# Patient Record
Sex: Male | Born: 1935 | Race: White | Hispanic: No | Smoking: Former smoker
Health system: Southern US, Community
[De-identification: ages and names within clinical notes are randomized; demographics above are authoritative.]

## PROBLEM LIST (undated history)

## (undated) DIAGNOSIS — E785 Hyperlipidemia, unspecified: Secondary | ICD-10-CM

## (undated) DIAGNOSIS — E559 Vitamin D deficiency, unspecified: Secondary | ICD-10-CM

## (undated) DIAGNOSIS — I10 Essential (primary) hypertension: Secondary | ICD-10-CM

## (undated) DIAGNOSIS — E291 Testicular hypofunction: Secondary | ICD-10-CM

## (undated) DIAGNOSIS — C801 Malignant (primary) neoplasm, unspecified: Secondary | ICD-10-CM

## (undated) DIAGNOSIS — E119 Type 2 diabetes mellitus without complications: Secondary | ICD-10-CM

## (undated) HISTORY — DX: Hyperlipidemia, unspecified: E78.5

## (undated) HISTORY — DX: Testicular hypofunction: E29.1

## (undated) HISTORY — DX: Vitamin D deficiency, unspecified: E55.9

## (undated) HISTORY — DX: Malignant (primary) neoplasm, unspecified: C80.1

## (undated) HISTORY — DX: Essential (primary) hypertension: I10

## (undated) HISTORY — DX: Type 2 diabetes mellitus without complications: E11.9

---

## 1998-02-05 ENCOUNTER — Ambulatory Visit (HOSPITAL_COMMUNITY): Admission: RE | Admit: 1998-02-05 | Discharge: 1998-02-05 | Payer: Self-pay | Admitting: Gastroenterology

## 1999-05-29 ENCOUNTER — Ambulatory Visit (HOSPITAL_COMMUNITY): Admission: RE | Admit: 1999-05-29 | Discharge: 1999-05-29 | Payer: Self-pay | Admitting: Internal Medicine

## 1999-05-29 ENCOUNTER — Encounter: Payer: Self-pay | Admitting: Internal Medicine

## 1999-11-27 ENCOUNTER — Encounter: Payer: Self-pay | Admitting: Internal Medicine

## 1999-11-27 ENCOUNTER — Ambulatory Visit (HOSPITAL_COMMUNITY): Admission: RE | Admit: 1999-11-27 | Discharge: 1999-11-27 | Payer: Self-pay | Admitting: Internal Medicine

## 2000-03-30 ENCOUNTER — Ambulatory Visit (HOSPITAL_BASED_OUTPATIENT_CLINIC_OR_DEPARTMENT_OTHER): Admission: RE | Admit: 2000-03-30 | Discharge: 2000-03-30 | Payer: Self-pay | Admitting: Plastic Surgery

## 2000-04-01 ENCOUNTER — Ambulatory Visit (HOSPITAL_BASED_OUTPATIENT_CLINIC_OR_DEPARTMENT_OTHER): Admission: RE | Admit: 2000-04-01 | Discharge: 2000-04-01 | Payer: Self-pay | Admitting: Plastic Surgery

## 2001-06-24 ENCOUNTER — Encounter: Payer: Self-pay | Admitting: Internal Medicine

## 2001-06-24 ENCOUNTER — Ambulatory Visit (HOSPITAL_COMMUNITY): Admission: RE | Admit: 2001-06-24 | Discharge: 2001-06-24 | Payer: Self-pay | Admitting: Internal Medicine

## 2003-08-05 ENCOUNTER — Ambulatory Visit (HOSPITAL_COMMUNITY): Admission: RE | Admit: 2003-08-05 | Discharge: 2003-08-05 | Payer: Self-pay | Admitting: Internal Medicine

## 2007-03-23 ENCOUNTER — Emergency Department (HOSPITAL_COMMUNITY): Admission: EM | Admit: 2007-03-23 | Discharge: 2007-03-23 | Payer: Self-pay | Admitting: Emergency Medicine

## 2007-09-22 ENCOUNTER — Ambulatory Visit (HOSPITAL_BASED_OUTPATIENT_CLINIC_OR_DEPARTMENT_OTHER): Admission: RE | Admit: 2007-09-22 | Discharge: 2007-09-22 | Payer: Self-pay | Admitting: Urology

## 2008-04-20 ENCOUNTER — Encounter: Admission: RE | Admit: 2008-04-20 | Discharge: 2008-04-20 | Payer: Self-pay | Admitting: Cardiothoracic Surgery

## 2010-03-06 ENCOUNTER — Encounter (HOSPITAL_COMMUNITY): Admission: RE | Admit: 2010-03-06 | Discharge: 2010-03-11 | Payer: Self-pay | Admitting: Urology

## 2011-01-07 NOTE — Op Note (Signed)
NAME:  Nicholas Whitney, Nicholas Whitney                 ACCOUNT NO.:  [id number]   MEDICAL RECORD NO.:  [id number]         PATIENT TYPE:  HAMB   LOCATION:                               FACILITY:  [facility]   PHYSICIAN:  Sigmund I. Patsi Sears, M.D.DATE OF BIRTH:  02-26-1929   DATE OF PROCEDURE:  09/22/2007  DATE OF DISCHARGE:                               OPERATIVE REPORT   PREOPERATIVE DIAGNOSIS:  Adenocarcinoma of the prostate, T1c.   POSTOPERATIVE DIAGNOSIS:  Adenocarcinoma of the prostate, T1c.   OPERATIONS:  Cryotherapy of the prostate.   SURGEON:  Sigmund I. Patsi Sears, M.D.   ANESTHESIA:  General LMA.   PREPARATION:  After appropriate preanesthesia, the patient was brought  to the operating room and placed on the operating room table in dorsal  supine position where general LMA anesthesia was induced.  He was then  replaced in dorsal lithotomy position where the pubis was prepped with  Betadine solution and draped in the usual fashion.   BRIEF HISTORY:  [name] is a 77 year old male with T1c adenocarcinoma  of the prostate, Gleason 3 plus 4 (7), in 60% of the left lateral  biopsies.  PSA is 6.37.  Original prostate size was 84 mL, and was  downsized to 47 mL with Lupron.  The patient's additional history is  significant for BPH treated with Terazosin, type 2 diabetes, ED and  hypogonadism.   PROCEDURE IN DETAIL:  With the patient in the supine position, general  LMA anesthesia was introduced.  Flexible cystoscopy was accomplished, in  the Trendelenburg position, and suprapubic 0.5 cm incision was made and  subcutaneous tissue dilated with a clamp.  A Cook suprapubic tube is  then placed under direct vision into the dome of the bladder and coiled  into position.  It is sutured in position with 3-0 nylon suture.  The  suture which comes with the [facility] suprapubic tube was nonfunctioning.  The tube was placed to straight drainage.  A Foley catheter was then  placed, and the scrotum was taped to  the abdominal wall.  The scrotum  was notably small, with atrophic testes within.   Transrectal ultrasonography was accomplished, and under real time  ultrasound control, cryotherapy needles were placed in 4 rows.  Thermocouple device was then placed in the Denonvilliers' fascia, as  well as at the urethral sphincter.   The patient then underwent 2 separate freeze - thaw cycles, with active  freezing to -40 degrees Centigrade, and passive thawing.  The patient  tolerated the procedure well.  There was minimal bleeding.  It is noted  that a urethral  warming device was used throughout the case, and was placed after  cystoscopy showed no needles within the urethra.  The patient was given  a B & O suppository at the end of the procedure, and was given IV  Toradol at the end of the procedure.  He was awakened and taken to the  recovery room in good condition.      Sigmund I. Patsi Sears, M.D.  Electronically Signed     SIT/MEDQ  D:  09/22/2007  T:  09/22/2007  Job:  [id number]   cc:   Lucky Cowboy, M.D.  Fax: [phone]

## 2011-01-10 NOTE — Op Note (Signed)
[place]. [facility]  Patient:    Nicholas Whitney, Nicholas Whitney                        MRN: [id number] Proc. Date: 04/01/00 Adm. Date:  [id number] Disc. Date: [id number] Attending:  Chapman Moss                           Operative Report  PREOPERATIVE DIAGNOSIS:  A complicated, open wound of the left ear helical rim secondary to basal cell carcinoma excision.  POSTOPERATIVE DIAGNOSIS: A complicated, open wound of the left ear helical rim secondary to basal cell carcinoma excision.  PROCEDURE PERFORMED:  A planned, staged procedure, a helical rim sliding ear flap for closure ear rim defect.  SURGEON:  Teena Irani. Odis Luster, M.D.  ANESTHESIA:  MAC sedation with 1% Xylocaine with 1:100,000 epinephrine.  CLINICAL NOTE:  A 77 year old man with a fairly large basal cell carcinoma at the helical rim of his left ear.  This was excised two days ago.  Permanent pathology report by verbal report from Dr. Perlie Gold ______ revealed negative margins.  He was now returned to surgery today for a planned staged procedure to reconstruct the defect.  It was necessary to delay the reconstruction in order to obtain the negative margin report.  He understood the nature of the procedure and risks of postoperative complications and the overall convalescence and wished to proceed.  DESCRIPTION OF PROCEDURE:  The patient taken to the operating room and placed supine.  After successful sedation, some 1% Xylocaine with epinephrine plus bicarb was then infiltrated and the dressing was removed.  He was prepped with Betadine and draped with sterile drapes.  The wound was cleaned by scraping the wound thoroughly and then irrigating it with saline.  The flap was then anesthetized using the Xylocaine with epinephrine plus bicarb.  This was performed as a more or less as a field block.  The incision was then placed along the helical rim along the helical crease down into the ear lobule.  The flap was  then elevated off the back of the ear using sharp dissection. Meticulous hemostasis with the electrocautery.  Thorough irrigation with saline and again meticulous hemostasis.  The advancement was performed and a Burroughs wedge was taken out at the ear lobule on the nonflap side in order to allow for a flap advancement.  Again, thorough check was made for hemostasis and it was noted to be excellent.  The insetting of the flap 4-0 nylon simple interrupted sutures, interrupted vertical mattress sutures as needed and some 5-0 nylon simple, interrupted sutures along the donor site. The flap had excellent color, excellent capillary refill right up to the very edge.  The wound was dressed with bacitracin and antibiotic ointment and Xeroform gauze and a very light 4 x 4 dressing.  He tolerated the procedure well.  DISPOSITION:  Keep his head elevated.  No lifting, no exercising, no showering.  Darvocet-N 100 for pain, has already been written previously, 1-2 p.o. q.6h. p.r.n. for pain.  Keflex 500 mg p.o. t.i.d.  Will see him back in the office in eight days for recheck or sooner if there are problems. DD:  04/01/00 TD:  04/01/00 Job: [id number]

## 2011-01-10 NOTE — Op Note (Signed)
[place]. [facility]  Patient:    Nicholas Whitney, Nicholas Whitney                        MRN: [id number] Proc. Date: 03/30/00 Adm. Date:  [id number] Attending:  Chapman Moss                           Operative Report  PREOPERATIVE DIAGNOSIS:  Basal cell carcinoma of the left ear greater than 1.0 cm in diameter.  POSTOPERATIVE DIAGNOSIS:  Basal cell carcinoma of the left ear greater than 1.0 cm in diameter.  PROCEDURE:  Excision basal cell carcinoma of the left ear greater than 1.0 cm. diameter.  SURGEON:  Teena Irani. Odis Luster, M.D.  ANESTHESIA:  1% XYLOCAINE with EPINEPHRINE plus ______  CLINICAL NOTE:  This is 77 year old man who presents for excision of a fairly large basal cell carcinoma of the helical rim of his left ear.  This is ulcerated and weeping.  The nature of the procedure and the risks were discussed with him.  He understands this to be planned staged procedures.  The excision today confirming negative margins on the pathology followed up in 2 days from now with a flap post surgery to reconstruct.  He was very accepting of this plan and wished to proceed.  PROCEDURE:  The patient was brought to the operating room and placed supine. Under Loupe magnification the planned excision was then marked and he was prepped with Betadine and draped in sterile drapes.  Subsequently local anesthesia was achieved and the excision was performed.  This excision did extend into the underlying cartilage.  Good hemostasis was noted.  A portion of the wound posteriorly was closed with 5-0 nylon interrupted vertical mattress sutures.  The specimen had already been removed from the table and tagged at its superior margin with a stitch.  Bacitracin antibiotic ointment and a xeroform gauze were then placed in the wound and the xeroform gauze was secured with a few 5-0 nylon simple interrupted sutures around the periphery. Dry sterile dressing applied over this.  He tolerated  the procedure well.  DISPOSITION:  Darvocet-N 100 total of 15 given 1 p.o. q.4h. p.r.n. for pain.  FOLLOWUP:  See him back here in 2 days for his reconstructive surgery.  Keep the dressing dry and intact.  ACTIVITY:  No lifting, no vigorous activities. DD:  03/30/00 TD:  03/31/00 Job: [id number]

## 2011-05-15 LAB — I-STAT 8, (EC8 V) (CONVERTED LAB)
BUN: 14
Chloride: 103
HCT: 39
Hemoglobin: 13.3
Operator id: 114531
TCO2: 32

## 2011-06-09 LAB — URINALYSIS, ROUTINE W REFLEX MICROSCOPIC
Glucose, UA: NEGATIVE
Ketones, ur: NEGATIVE
Leukocytes, UA: NEGATIVE
Nitrite: NEGATIVE
Protein, ur: NEGATIVE
Specific Gravity, Urine: 1.011
Urobilinogen, UA: 0.2
pH: 6.5

## 2011-06-09 LAB — URINE MICROSCOPIC-ADD ON

## 2013-04-21 ENCOUNTER — Other Ambulatory Visit: Payer: Self-pay | Admitting: Internal Medicine

## 2013-04-21 DIAGNOSIS — R413 Other amnesia: Secondary | ICD-10-CM

## 2013-05-11 ENCOUNTER — Ambulatory Visit
Admission: RE | Admit: 2013-05-11 | Discharge: 2013-05-11 | Disposition: A | Discharge status: Home / Self Care | Payer: Medicare Other | Source: Ambulatory Visit | Attending: Internal Medicine | Admitting: Internal Medicine

## 2013-05-11 DIAGNOSIS — R413 Other amnesia: Secondary | ICD-10-CM

## 2013-05-11 MED ORDER — IOHEXOL 300 MG/ML  SOLN
75.0000 mL | Freq: Once | INTRAMUSCULAR | Status: AC | PRN
  Administered 2013-05-11: 75 mL via INTRAVENOUS

## 2013-07-18 ENCOUNTER — Encounter: Payer: Self-pay | Admitting: Internal Medicine

## 2013-07-19 ENCOUNTER — Ambulatory Visit: Payer: Medicare Other | Admitting: Internal Medicine

## 2013-07-19 ENCOUNTER — Encounter: Payer: Self-pay | Admitting: Internal Medicine

## 2013-07-19 VITALS — BP 138/60 | HR 64 | Temp 98.2°F | Resp 18 | Ht 65.75 in | Wt 165.8 lb

## 2013-07-19 DIAGNOSIS — E119 Type 2 diabetes mellitus without complications: Secondary | ICD-10-CM

## 2013-07-19 DIAGNOSIS — E1129 Type 2 diabetes mellitus with other diabetic kidney complication: Secondary | ICD-10-CM | POA: Insufficient documentation

## 2013-07-19 DIAGNOSIS — Z79899 Other long term (current) drug therapy: Secondary | ICD-10-CM

## 2013-07-19 DIAGNOSIS — E782 Mixed hyperlipidemia: Secondary | ICD-10-CM | POA: Insufficient documentation

## 2013-07-19 DIAGNOSIS — E559 Vitamin D deficiency, unspecified: Secondary | ICD-10-CM | POA: Insufficient documentation

## 2013-07-19 DIAGNOSIS — F0280 Dementia in other diseases classified elsewhere without behavioral disturbance: Secondary | ICD-10-CM

## 2013-07-19 DIAGNOSIS — I10 Essential (primary) hypertension: Secondary | ICD-10-CM | POA: Insufficient documentation

## 2013-07-19 LAB — CBC WITH DIFFERENTIAL/PLATELET
Basophils Absolute: 0.1 10*3/uL (ref 0.0–0.1)
Basophils Relative: 1 % (ref 0–1)
Eosinophils Relative: 2 % (ref 0–5)
Hemoglobin: 13.4 g/dL (ref 13.0–17.0)
Lymphocytes Relative: 37 % (ref 12–46)
MCV: 89.3 fL (ref 78.0–100.0)
Neutrophils Relative %: 51 % (ref 43–77)
RBC: 4.41 MIL/uL (ref 4.22–5.81)
WBC: 8.1 10*3/uL (ref 4.0–10.5)

## 2013-07-19 MED ORDER — CITALOPRAM HYDROBROMIDE 40 MG PO TABS: 20.0000 mg | ORAL_TABLET | Freq: Every day | ORAL | Status: AC

## 2013-07-19 NOTE — Patient Instructions (Addendum)
Continue diet & medications same as discussed.   Further disposition pending lab results.    Recommend decrease Citalopram to 1/2 tab  = 20 mg daily   Hypertension As your heart beats, it forces blood through your arteries. This force is your blood pressure. If the pressure is too high, it is called hypertension (HTN) or high blood pressure. HTN is dangerous because you may have it and not know it. High blood pressure may mean that your heart has to work harder to pump blood. Your arteries may be narrow or stiff. The extra work puts you at risk for heart disease, stroke, and other problems.  Blood pressure consists of two numbers, a higher number over a lower, 110/72, for example. It is stated as "110 over 72." The ideal is below 120 for the top number (systolic) and under 80 for the bottom (diastolic). Write down your blood pressure today. You should pay close attention to your blood pressure if you have certain conditions such as:  Heart failure.  Prior heart attack.  Diabetes  Chronic kidney disease.  Prior stroke.  Multiple risk factors for heart disease. To see if you have HTN, your blood pressure should be measured while you are seated with your arm held at the level of the heart. It should be measured at least twice. A one-time elevated blood pressure reading (especially in the Emergency Department) does not mean that you need treatment. There may be conditions in which the blood pressure is different between your right and left arms. It is important to see your caregiver soon for a recheck. Most people have essential hypertension which means that there is not a specific cause. This type of high blood pressure may be lowered by changing lifestyle factors such as:  Stress.  Smoking.  Lack of exercise.  Excessive weight.  Drug/tobacco/alcohol use.  Eating less salt. Most people do not have symptoms from high blood pressure until it has caused damage to the body.  Effective treatment can often prevent, delay or reduce that damage. TREATMENT  When a cause has been identified, treatment for high blood pressure is directed at the cause. There are a large number of medications to treat HTN. These fall into several categories, and your caregiver will help you select the medicines that are best for you. Medications may have side effects. You should review side effects with your caregiver. If your blood pressure stays high after you have made lifestyle changes or started on medicines,   Your medication(s) may need to be changed.  Other problems may need to be addressed.  Be certain you understand your prescriptions, and know how and when to take your medicine.  Be sure to follow up with your caregiver within the time frame advised (usually within two weeks) to have your blood pressure rechecked and to review your medications.  If you are taking more than one medicine to lower your blood pressure, make sure you know how and at what times they should be taken. Taking two medicines at the same time can result in blood pressure that is too low. SEEK IMMEDIATE MEDICAL CARE IF:  You develop a severe headache, blurred or changing vision, or confusion.  You have unusual weakness or numbness, or a faint feeling.  You have severe chest or abdominal pain, vomiting, or breathing problems. MAKE SURE YOU:   Understand these instructions.  Will watch your condition.  Will get help right away if you are not doing well or get worse.  Document Released: 08/11/2005 Document Revised: 11/03/2011 Document Reviewed: 03/31/2008 [facility] Patient Information 2014 [place], [place].  Diabetes and Exercise Exercising regularly is important. It is not just about losing weight. It has many health benefits, such as:  Improving your overall fitness, flexibility, and endurance.  Increasing your bone density.  Helping with weight control.  Decreasing your body fat.  Increasing  your muscle strength.  Reducing stress and tension.  Improving your overall health. People with diabetes who exercise gain additional benefits because exercise:  Reduces appetite.  Improves the body's use of blood sugar (glucose).  Helps lower or control blood glucose.  Decreases blood pressure.  Helps control blood lipids (such as cholesterol and triglycerides).  Improves the body's use of the hormone insulin by:  Increasing the body's insulin sensitivity.  Reducing the body's insulin needs.  Decreases the risk for heart disease because exercising:  Lowers cholesterol and triglycerides levels.  Increases the levels of good cholesterol (such as high-density lipoproteins [HDL]) in the body.  Lowers blood glucose levels. YOUR ACTIVITY PLAN  Choose an activity that you enjoy and set realistic goals. Your health care provider or diabetes educator can help you make an activity plan that works for you. You can break activities into 2 or 3 sessions throughout the day. Doing so is as good as one long session. Exercise ideas include:  Taking the dog for a walk.  Taking the stairs instead of the elevator.  Dancing to your favorite song.  Doing your favorite exercise with a friend. RECOMMENDATIONS FOR EXERCISING WITH TYPE 1 OR TYPE 2 DIABETES   Check your blood glucose before exercising. If blood glucose levels are greater than 240 mg/dL, check for urine ketones. Do not exercise if ketones are present.  Avoid injecting insulin into areas of the body that are going to be exercised. For example, avoid injecting insulin into:  The arms when playing tennis.  The legs when jogging.  Keep a record of:  Food intake before and after you exercise.  Expected peak times of insulin action.  Blood glucose levels before and after you exercise.  The type and amount of exercise you have done.  Review your records with your health care provider. Your health care provider will help you  to develop guidelines for adjusting food intake and insulin amounts before and after exercising.  If you take insulin or oral hypoglycemic agents, watch for signs and symptoms of hypoglycemia. They include:  Dizziness.  Shaking.  Sweating.  Chills.  Confusion.  Drink plenty of water while you exercise to prevent dehydration or heat stroke. Body water is lost during exercise and must be replaced.  Talk to your health care provider before starting an exercise program to make sure it is safe for you. Remember, almost any type of activity is better than none. Document Released: 11/01/2003 Document Revised: 04/13/2013 Document Reviewed: 01/18/2013 [facility] Patient Information 2014 [place], [place].    Cholesterol Cholesterol is a white, waxy, fat-like protein needed by your body in small amounts. The liver makes all the cholesterol you need. It is carried from the liver by the blood through the blood vessels. Deposits (plaque) may build up on blood vessel walls. This makes the arteries narrower and stiffer. Plaque increases the risk for heart attack and stroke. You cannot feel your cholesterol level even if it is very high. The only way to know is by a blood test to check your lipid (fats) levels. Once you know your cholesterol levels, you should keep a  record of the test results. Work with your caregiver to to keep your levels in the desired range. WHAT THE RESULTS MEAN:  Total cholesterol is a rough measure of all the cholesterol in your blood.  LDL is the so-called bad cholesterol. This is the type that deposits cholesterol in the walls of the arteries. You want this level to be low.  HDL is the good cholesterol because it cleans the arteries and carries the LDL away. You want this level to be high.  Triglycerides are fat that the body can either burn for energy or store. High levels are closely linked to heart disease. DESIRED LEVELS:  Total cholesterol below 200.  LDL below 100  for people at risk, below 70 for very high risk.  HDL above 50 is good, above 60 is best.  Triglycerides below 150. HOW TO LOWER YOUR CHOLESTEROL:  Diet.  Choose fish or white meat chicken and [place], roasted or baked. Limit fatty cuts of red meat, fried foods, and processed meats, such as sausage and lunch meat.  Eat lots of fresh fruits and vegetables. Choose whole grains, beans, pasta, potatoes and cereals.  Use only small amounts of olive, corn or canola oils. Avoid butter, mayonnaise, shortening or palm kernel oils. Avoid foods with trans-fats.  Use skim/nonfat milk and low-fat/nonfat yogurt and cheeses. Avoid whole milk, cream, ice cream, egg yolks and cheeses. Healthy desserts include angel food cake, ginger snaps, animal crackers, hard candy, popsicles, and low-fat/nonfat frozen yogurt. Avoid pastries, cakes, pies and cookies.  Exercise.  A regular program helps decrease LDL and raises HDL.  Helps with weight control.  Do things that increase your activity level like gardening, walking, or taking the stairs.  Medication.  May be prescribed by your caregiver to help lowering cholesterol and the risk for heart disease.  You may need medicine even if your levels are normal if you have several risk factors. HOME CARE INSTRUCTIONS   Follow your diet and exercise programs as suggested by your caregiver.  Take medications as directed.  Have blood work done when your caregiver feels it is necessary. MAKE SURE YOU:   Understand these instructions.  Will watch your condition.  Will get help right away if you are not doing well or get worse. Document Released: 05/06/2001 Document Revised: 11/03/2011 Document Reviewed: 10/27/2007 [facility] Patient Information 2014 [place], [place].  Vitamin D Deficiency Vitamin D is an important vitamin that your body needs. Having too little of it in your body is called a deficiency. A very bad deficiency can make your bones soft and can  cause a condition called rickets.  Vitamin D is important to your body for different reasons, such as:   It helps your body absorb 2 minerals called calcium and phosphorus.  It helps make your bones healthy.  It may prevent some diseases, such as diabetes and multiple sclerosis.  It helps your muscles and heart. You can get vitamin D in several ways. It is a natural part of some foods. The vitamin is also added to some dairy products and cereals. Some people take vitamin D supplements. Also, your body makes vitamin D when you are in the sun. It changes the sun's rays into a form of the vitamin that your body can use. CAUSES   Not eating enough foods that contain vitamin D.  Not getting enough sunlight.  Having certain digestive system diseases that make it hard to absorb vitamin D. These diseases include Crohn's disease, chronic pancreatitis, and cystic  fibrosis.  Having a surgery in which part of the stomach or small intestine is removed.  Being obese. Fat cells pull vitamin D out of your blood. That means that obese people may not have enough vitamin D left in their blood and in other body tissues.  Having chronic kidney or liver disease. RISK FACTORS Risk factors are things that make you more likely to develop a vitamin D deficiency. They include:  Being older.  Not being able to get outside very much.  Living in a nursing home.  Having had broken bones.  Having weak or thin bones (osteoporosis).  Having a disease or condition that changes how your body absorbs vitamin D.  Having dark skin.  Some medicines such as seizure medicines or steroids.  Being overweight or obese. SYMPTOMS Mild cases of vitamin D deficiency may not have any symptoms. If you have a very bad case, symptoms may include:  Bone pain.  Muscle pain.  Falling often.  Broken bones caused by a minor injury, due to osteoporosis. DIAGNOSIS A blood test is the best way to tell if you have a  vitamin D deficiency. TREATMENT Vitamin D deficiency can be treated in different ways. Treatment for vitamin D deficiency depends on what is causing it. Options include:  Taking vitamin D supplements.  Taking a calcium supplement. Your caregiver will suggest what dose is best for you. HOME CARE INSTRUCTIONS  Take any supplements that your caregiver prescribes. Follow the directions carefully. Take only the suggested amount.  Have your blood tested 2 months after you start taking supplements.  Eat foods that contain vitamin D. Healthy choices include:  Fortified dairy products, cereals, or juices. Fortified means vitamin D has been added to the food. Check the label on the package to be sure.  Fatty fish like salmon or trout.  Eggs.  Oysters.  Do not use a tanning bed.  Keep your weight at a healthy level. Lose weight if you need to.  Keep all follow-up appointments. Your caregiver will need to perform blood tests to make sure your vitamin D deficiency is going away. SEEK MEDICAL CARE IF:  You have any questions about your treatment.  You continue to have symptoms of vitamin D deficiency.  You have nausea or vomiting.  You are constipated.  You feel confused.  You have severe abdominal or back pain. MAKE SURE YOU:  Understand these instructions.  Will watch your condition.  Will get help right away if you are not doing well or get worse. Document Released: 11/03/2011 Document Revised: 12/06/2012 Document Reviewed: 11/03/2011 [facility] Patient Information 2014 [place], [place].

## 2013-07-19 NOTE — Progress Notes (Signed)
Patient ID: Nicholas Whitney, male   DOB: [birth year], 79 y.o.   MRN: [id number]   This very nice 79 yo WWM presents for 3 month follow up with hypertension, hyperlipidemia, T2 diabetes and vitamin D deficiency.    BP has been controlled at home. Today's BP is 138/60. Patient denies any cardiac type chest pain, palpitations, dyspnea/orthopnea/PND, dizziness, claudication, or dependent edema.   Hyperlipidemia is controlled with diet. Last cholesterol was 143, Triglycerides were 186, HDL 47, and LDL 59 at goal. Patient denies myalgias or other med/supplement  SE's.     Patient has mild dementia requiring supervision which became more apparent when his wife died untimely in [place] this year. He did have a Head CTscan in September which showed extensive white matter microvascular changes. Apparently he is able to function independently in the home environment with supervision as one daughter resides with him.   Also, the patient's diabetes has been controlled with diet and medications. Last A1c was7.3% in September. His daughter states his FBGs usually rasnge 100-140's range.  . Patient denies any symptoms of reactive hypoglycemia, diabetic polys, paresthesias or visual blurring.   Further, Patient has history of vitamin D deficiency with last vitamin D of 61 in september. Patient supplements vitamin without any suspected side-effects.  Medication Sig Dispense Refill  . aspirin 81 MG tablet Take 81 mg by mouth daily.      . cholecalciferol (VITAMIN D) 1000 UNITS tablet Take 1,000 Units by mouth daily.      Marland Kitchen donepezil (ARICEPT) 10 MG tablet Take 10 mg by mouth at bedtime.      Marland Kitchen glyBURIDE (DIABETA) 5 MG tablet Take 5 mg by mouth daily with breakfast.      . lisinopril (PRINIVIL,ZESTRIL) 20 MG tablet Take 20 mg by mouth daily.      . metFORMIN (GLUMETZA) 500 MG (MOD) 24 hr tablet Take 500 mg by mouth daily with breakfast.       No current facility-administered medications on file prior to visit.      Allergies  Allergen Reactions  . Atenolol Other (See Comments)    Decreased heart rate  . Ppd [Tuberculin Purified Protein Derivative]     Positive test  . Sporanox [Itraconazole] Hives    PMHx:   Past Medical History  Diagnosis Date  . Hypertension   . Hyperlipidemia   . Diabetes mellitus without complication   . Cancer   . Hypogonadism male   . Vitamin D deficiency     FHx:    Reviewed / unchanged  SHx:    Reviewed / unchanged  Systems Review: Constitutional: Denies fever, chills, wt changes, headaches, insomnia, fatigue, night sweats, change in appetite. Eyes: Denies redness, blurred vision, diplopia, discharge, itchy, watery eyes.  ENT: Denies discharge, congestion, post nasal drip, epistaxis, sore throat, earache, hearing loss, dental pain, tinnitus, vertigo, sinus pain, snoring.  CV: Denies chest pain, palpitations, irregular heartbeat, syncope, dyspnea, diaphoresis, orthopnea, PND, claudication, edema. Respiratory: denies cough, dyspnea, DOE, pleurisy, hoarseness, laryngitis, wheezing.  Gastrointestinal: Denies dysphagia, odynophagia, heartburn, reflux, water brash, abdominal pain or cramps, nausea, vomiting, bloating, diarrhea, constipation, hematemesis, melena, hematochezia,  Hemorrhoids. Genitourinary: Denies dysuria, frequency, urgency, nocturia, hesitancy, discharge, hematuria, flank pain. Musculoskeletal: Denies arthralgias, myalgias, stiffness, jt. swelling, pain, limp, strain/sprain.  Skin: Denies pruritus, rash, hives, warts, acne, eczema, change in skin lesion(s). Neuro: No weakness, tremor, incoordination, spasms, paresthesia or no reported sensory loss, or pain. Psychiatric: Daughter reports mild confusion, memory loss. Endo: Denies change in weight, skin,  hair change.  Heme/Lymph: No excessive bleeding, bruising, orenlarged lymph nodes.  Filed Vitals:   07/19/13 1458  BP: 138/60  Pulse: 64  Temp: 98.2 F (36.8 C)  Resp: 18    BMI is 26.97  kg/(m^2)    Height     5' 5.75"     Weight   165 lb 12.8 oz   On Exam:  Appears well nourished - in no distress. Eyes: PERRLA, EOMs, conjunctiva no swelling or erythema. Sinuses: No frontal/maxillary tenderness ENT/Mouth: EAC's clear, TM's nl w/o erythema, bulging. Nares clear w/o erythema, swelling, exudates. Oropharynx clear without erythema or exudates. Oral hygiene is good. Tongue normal, non obstructing. Hearing intact.  Neck: Supple. Thyroid nl. Car 2+/2+ without bruits, nodes or JVD. Chest: Respirations nl with BS clear & equal w/o rales, rhonchi, wheezing or stridor.  Cor: Heart sounds normal w/ regular rate and rhythm without sig. murmurs, gallops, clicks, or rubs. Peripheral pulses normal and equal  without edema.  Abdomen: Soft & bowel sounds normal. Non-tender w/o guarding, rebound, hernias, masses, or organomegaly.  Lymphatics: Unremarkable.  Musculoskeletal: Full ROM all peripheral extremities, joint stability, 5/5 strength, and normal gait.  Skin: Warm, dry without exposed rashes, lesions, ecchymosis apparent.  Neuro: Cranial nerves intact, reflexes equal bilaterally. Sensory-motor testing grossly intact. Tendon reflexes grossly intact.  Pysch: Alert & oriented x 3. Insight and judgement nl & appropriate. No ideations.  Assessment and Plan:  1. Hypertension - Continue monitor blood pressure at home. Continue diet/meds same.  2. Hyperlipidemia - Continue diet/meds, exercise,& lifestyle modifications. Continue monitor periodic cholesterol/liver & renal functions   3. Diabetes - continue recommend prudent low glycemic diet, weight control, regular exercise, diabetic monitoring and periodic eye exams.  4. Vitamin D Deficiency - Continue supplementation.  5. Mild SDAT- continue Aricept & recommend decrease Citalopram to 1/2 tab = 20 mg qd  Further disposition pending results of labs.

## 2013-07-20 LAB — LIPID PANEL
HDL: 53 mg/dL (ref >39)
LDL Cholesterol: 57 mg/dL (ref 0–99)
Total CHOL/HDL Ratio: 2.9 Ratio

## 2013-07-20 LAB — BASIC METABOLIC PANEL WITH GFR
Glucose, Bld: 156 mg/dL — ABNORMAL HIGH (ref 70–99)
Potassium: 4.6 mEq/L (ref 3.5–5.3)

## 2013-07-20 LAB — TSH: TSH: 0.387 u[IU]/mL (ref 0.350–4.500)

## 2013-07-20 LAB — HEPATIC FUNCTION PANEL
ALT: 13 U/L (ref 0–53)
Albumin: 4.2 g/dL (ref 3.5–5.2)
Alkaline Phosphatase: 50 U/L (ref 39–117)
Indirect Bilirubin: 0.4 mg/dL (ref 0.0–0.9)
Total Bilirubin: 0.5 mg/dL (ref 0.3–1.2)

## 2013-07-20 LAB — HEMOGLOBIN A1C
Hgb A1c MFr Bld: 7.4 % — ABNORMAL HIGH (ref <5.7)
Mean Plasma Glucose: 166 mg/dL — ABNORMAL HIGH (ref <117)

## 2013-07-20 LAB — INSULIN, FASTING: Insulin fasting, serum: 63 u[IU]/mL — ABNORMAL HIGH (ref 3–28)

## 2013-09-19 ENCOUNTER — Other Ambulatory Visit: Payer: Self-pay | Admitting: Internal Medicine

## 2013-09-19 ENCOUNTER — Ambulatory Visit: Payer: Self-pay | Admitting: Emergency Medicine

## 2013-09-19 MED ORDER — LISINOPRIL 20 MG PO TABS: 20.0000 mg | ORAL_TABLET | Freq: Every day | ORAL | Status: AC

## 2013-09-19 MED ORDER — LISINOPRIL 20 MG PO TABS: 20.0000 mg | ORAL_TABLET | Freq: Every day | ORAL | Status: DC

## 2013-10-26 ENCOUNTER — Ambulatory Visit: Payer: Self-pay | Admitting: Emergency Medicine

## 2013-12-22 ENCOUNTER — Encounter: Payer: Self-pay | Admitting: Internal Medicine

## 2013-12-22 DIAGNOSIS — Z79899 Other long term (current) drug therapy: Secondary | ICD-10-CM | POA: Insufficient documentation

## 2013-12-22 DIAGNOSIS — Z0000 Encounter for general adult medical examination without abnormal findings: Secondary | ICD-10-CM

## 2013-12-22 NOTE — Progress Notes (Signed)
Patient ID: Nicholas Whitney, male   DOB: [birth year], 80 y.o.   MRN: [id number]    N O    S H O W     1. Annual Screening Examination 2. Hypertension  3. Hyperlipidemia 4. Pre Diabetes 5. Vitamin D Deficiency

## 2013-12-26 ENCOUNTER — Encounter: Payer: Self-pay | Admitting: Internal Medicine

## 2014-04-25 ENCOUNTER — Other Ambulatory Visit: Payer: Self-pay | Admitting: Physician Assistant

## 2014-05-09 ENCOUNTER — Other Ambulatory Visit: Payer: Self-pay | Admitting: Physician Assistant

## 2014-08-19 ENCOUNTER — Encounter: Payer: Self-pay | Admitting: *Deleted

## 2014-12-05 IMAGING — CT CT HEAD WO/W CM
3 of 4 series · 17 of 30 positions shown, 19 images · IV contrast (75CC OMNI 300)
Comparison: None

CLINICAL DATA: Memory loss. Prostate cancer

EXAM:
CT HEAD WITHOUT AND WITH CONTRAST
CONTRAST:  75mL OMNIPAQUE IOHEXOL 300 MG/ML  SOLN

[Series 3: head bone · axial · 0.49mm/px · z∈[+48,+147]mm · 5 of 28 slices shown]
[im 5/28  bone]
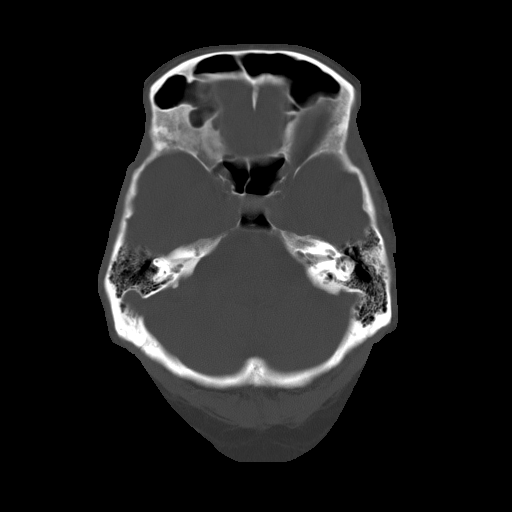
[im 10/28  bone]
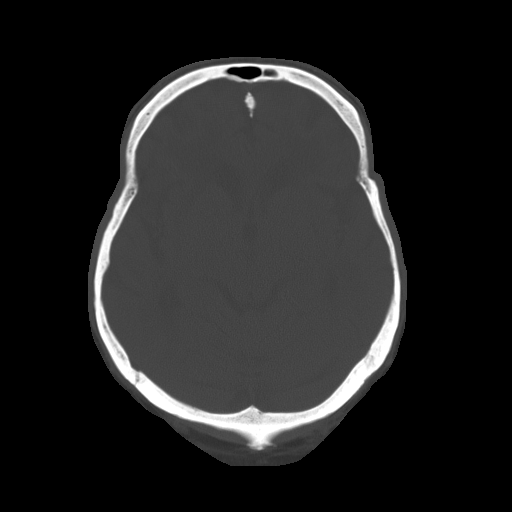
[im 14/28  bone]
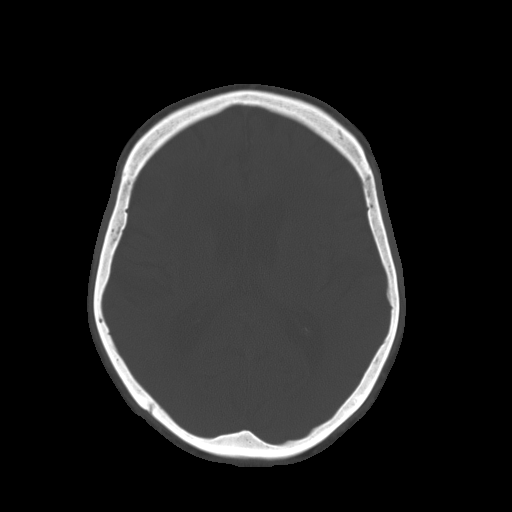
[im 19/28  bone]
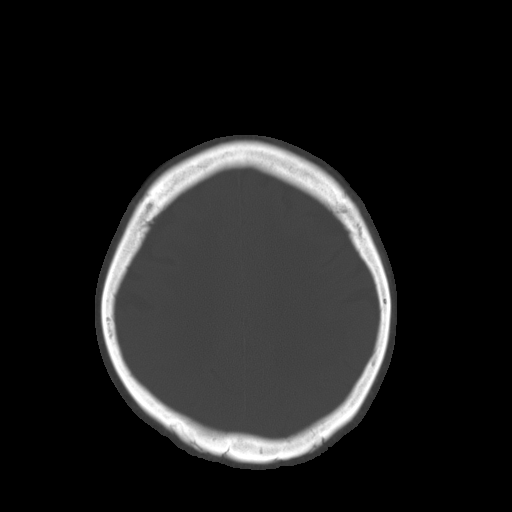
[im 23/28  bone]
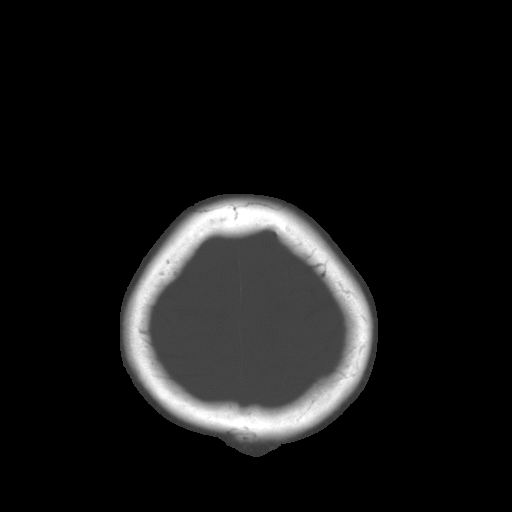

[Series 32: 3d filtered head w/o · axial · non-contrast · 0.49mm/px · z∈[+42,+152]mm · 6 of 28 slices shown, 8 images (1 of 2)]
[im 4/28  brain]
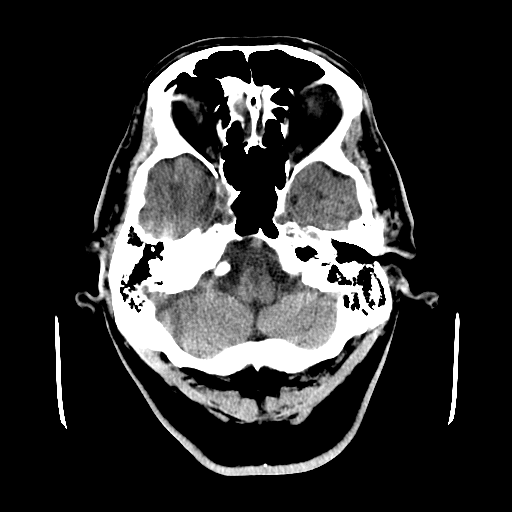
[im 4/28  bone]
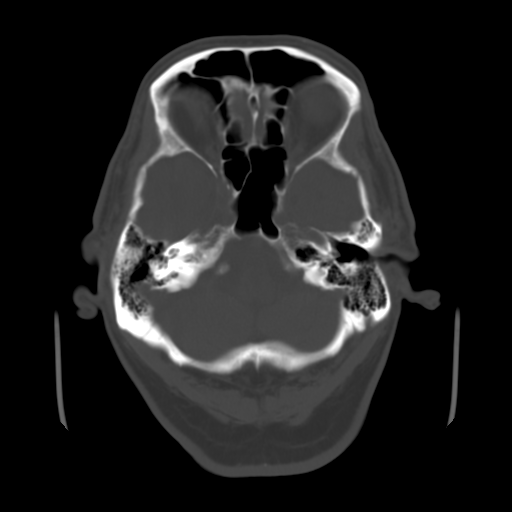
[im 8/28  brain]
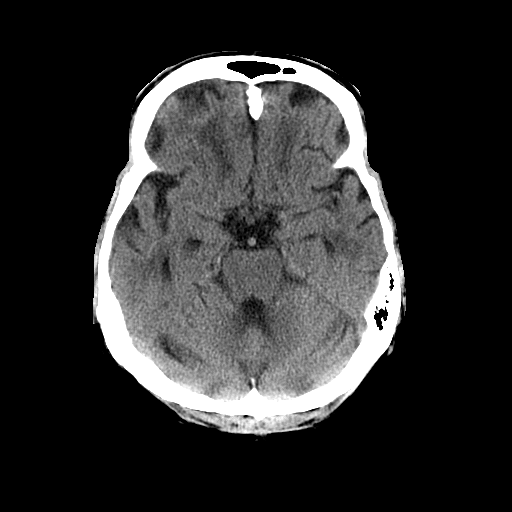
[im 12/28  brain]
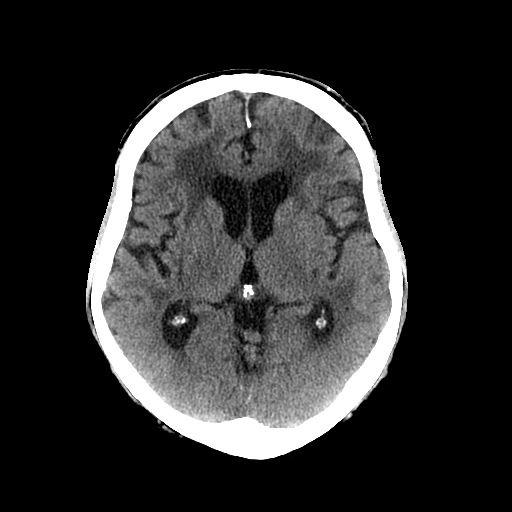
[im 16/28  brain]
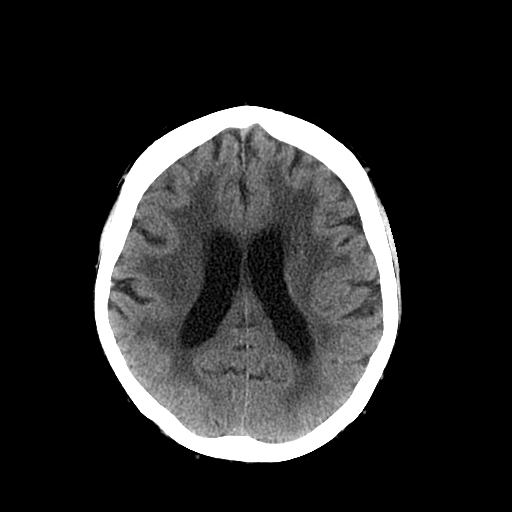
[im 20/28  brain]
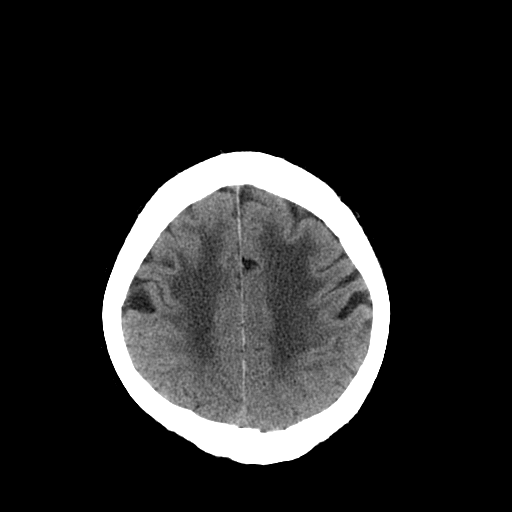
[im 20/28  bone]
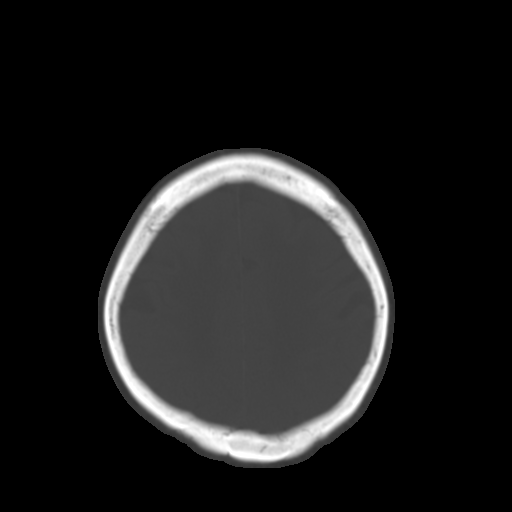
[im 24/28  brain]
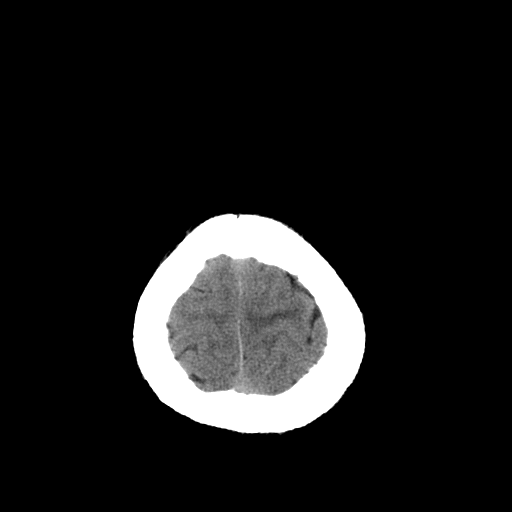

[Series 34: 3d filtered head w/o · axial · non-contrast · 0.49mm/px · z∈[+42,+152]mm · 6 of 28 slices shown (2 of 2)]
[im 4/28  brain]
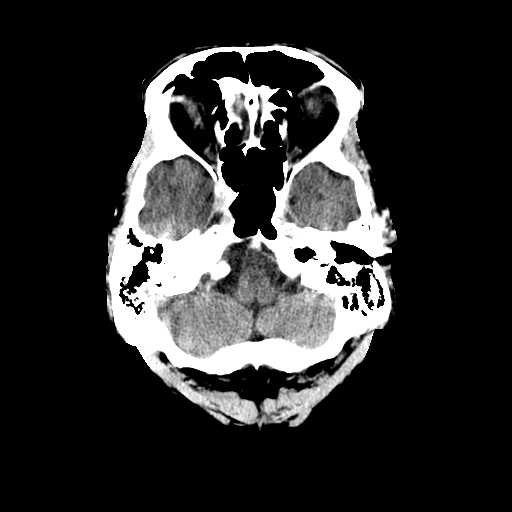
[im 8/28  brain]
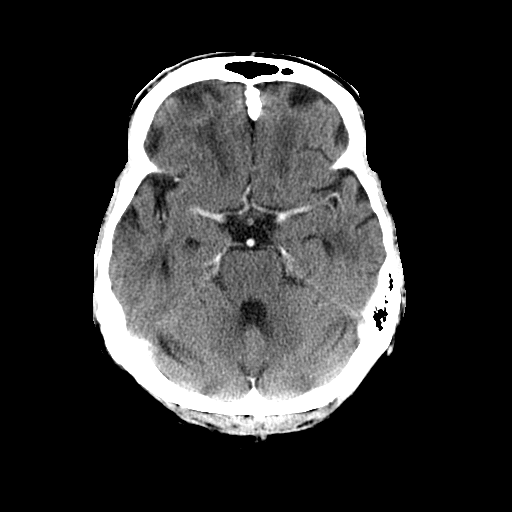
[im 12/28  brain]
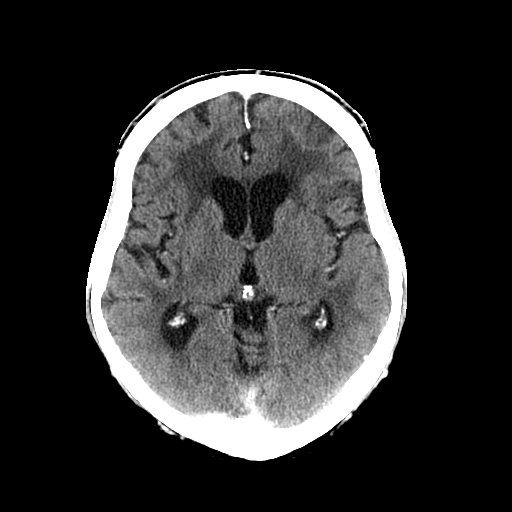
[im 16/28  brain]
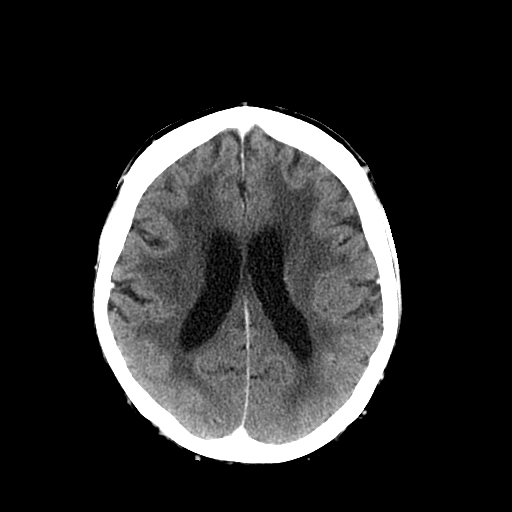
[im 20/28  brain]
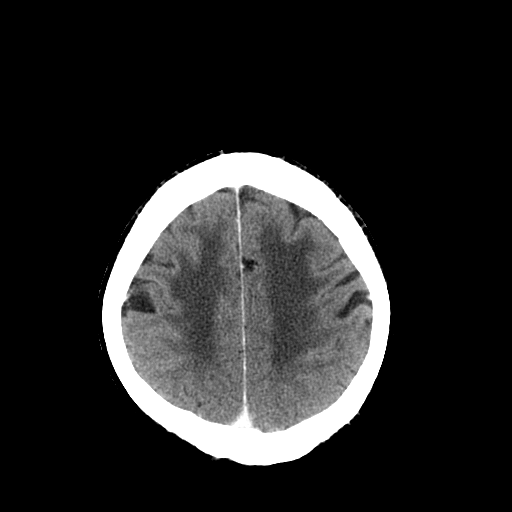
[im 24/28  brain]
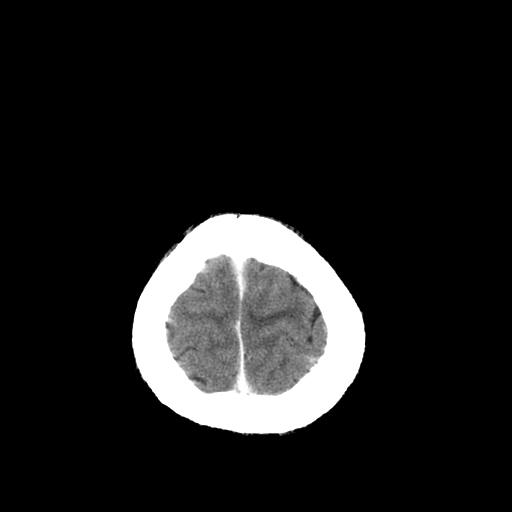

[17 of 30 positions shown; findings below may reference images not displayed]

FINDINGS: Mild atrophy. Negative for hydrocephalus.

Extensive hypodensity throughout the cerebral white matter
bilaterally. No enhancing lesions are identified. No mass effect or
midline shift.

Negative for hemorrhage or mass. Normal enhancement following
contrast administration.

No skull lesion. Atherosclerotic disease is present.
IMPRESSION: Extensive white matter disease without abnormal enhancement. This
may be due to microvascular ischemia or prior chemotherapy
administration. No evidence of metastatic disease. .

:
Contiguous axial images were obtained from the base of the skull
through the vertex without and with intravenous contrast

## 2014-12-09 ENCOUNTER — Encounter: Payer: Self-pay | Admitting: *Deleted

## 2014-12-26 ENCOUNTER — Encounter: Payer: Self-pay | Admitting: Internal Medicine

## 2019-06-26 DEATH — deceased
